# Patient Record
Sex: Male | Born: 1980 | Race: White | Hispanic: No | Marital: Single | Smoking: Never smoker
Health system: Southern US, Community
[De-identification: ages and names within clinical notes are randomized; demographics above are authoritative.]

## PROBLEM LIST (undated history)

## (undated) HISTORY — PX: FRACTURE SURGERY: SHX138

## (undated) HISTORY — PX: EYE SURGERY: SHX253

---

## 1999-05-04 ENCOUNTER — Ambulatory Visit (HOSPITAL_COMMUNITY): Admission: AD | Admit: 1999-05-04 | Discharge: 1999-05-04 | Payer: Self-pay | Admitting: Ophthalmology

## 1999-05-04 ENCOUNTER — Encounter (INDEPENDENT_AMBULATORY_CARE_PROVIDER_SITE_OTHER): Payer: Self-pay | Admitting: *Deleted

## 1999-05-04 ENCOUNTER — Encounter: Payer: Self-pay | Admitting: Ophthalmology

## 1999-12-28 ENCOUNTER — Encounter: Payer: Self-pay | Admitting: Surgery

## 1999-12-28 ENCOUNTER — Inpatient Hospital Stay (HOSPITAL_COMMUNITY): Admission: EM | Admit: 1999-12-28 | Discharge: 1999-12-30 | Payer: Self-pay | Admitting: Surgery

## 2000-02-03 ENCOUNTER — Encounter: Admission: RE | Admit: 2000-02-03 | Discharge: 2000-02-03 | Payer: Self-pay | Admitting: Neurosurgery

## 2000-04-04 ENCOUNTER — Encounter: Admission: RE | Admit: 2000-04-04 | Discharge: 2000-04-04 | Payer: Self-pay | Admitting: Neurosurgery

## 2004-11-15 ENCOUNTER — Emergency Department (HOSPITAL_COMMUNITY): Admission: EM | Admit: 2004-11-15 | Discharge: 2004-11-16 | Payer: Self-pay | Admitting: Emergency Medicine

## 2004-11-18 ENCOUNTER — Ambulatory Visit (HOSPITAL_COMMUNITY): Admission: RE | Admit: 2004-11-18 | Discharge: 2004-11-18 | Payer: Self-pay | Admitting: Orthopedic Surgery

## 2005-02-24 ENCOUNTER — Ambulatory Visit (HOSPITAL_COMMUNITY): Admission: RE | Admit: 2005-02-24 | Discharge: 2005-02-24 | Payer: Self-pay | Admitting: Orthopedic Surgery

## 2006-07-22 IMAGING — CR DG ANKLE COMPLETE 3+V*L*
3 series · 3 of 3 positions shown · non-contrast
Comparison: none

CLINICAL DATA: 23-year-old, injured ankle playing soccer.
 LEFT ANKLE ? 3 VIEW:
 There is a displaced transverse fracture through the distal aspect of the fibular shaft well above the ankle mortis.  There is also a displaced fracture involving the medial malleolus which is right at the level of the ankle mortis.  The tibia is shifted medially with some ankle mortis widening.  The posterior malleolus is intact.  The talus and subtalar joints appear normal.

[view not recorded (1 of 3)]
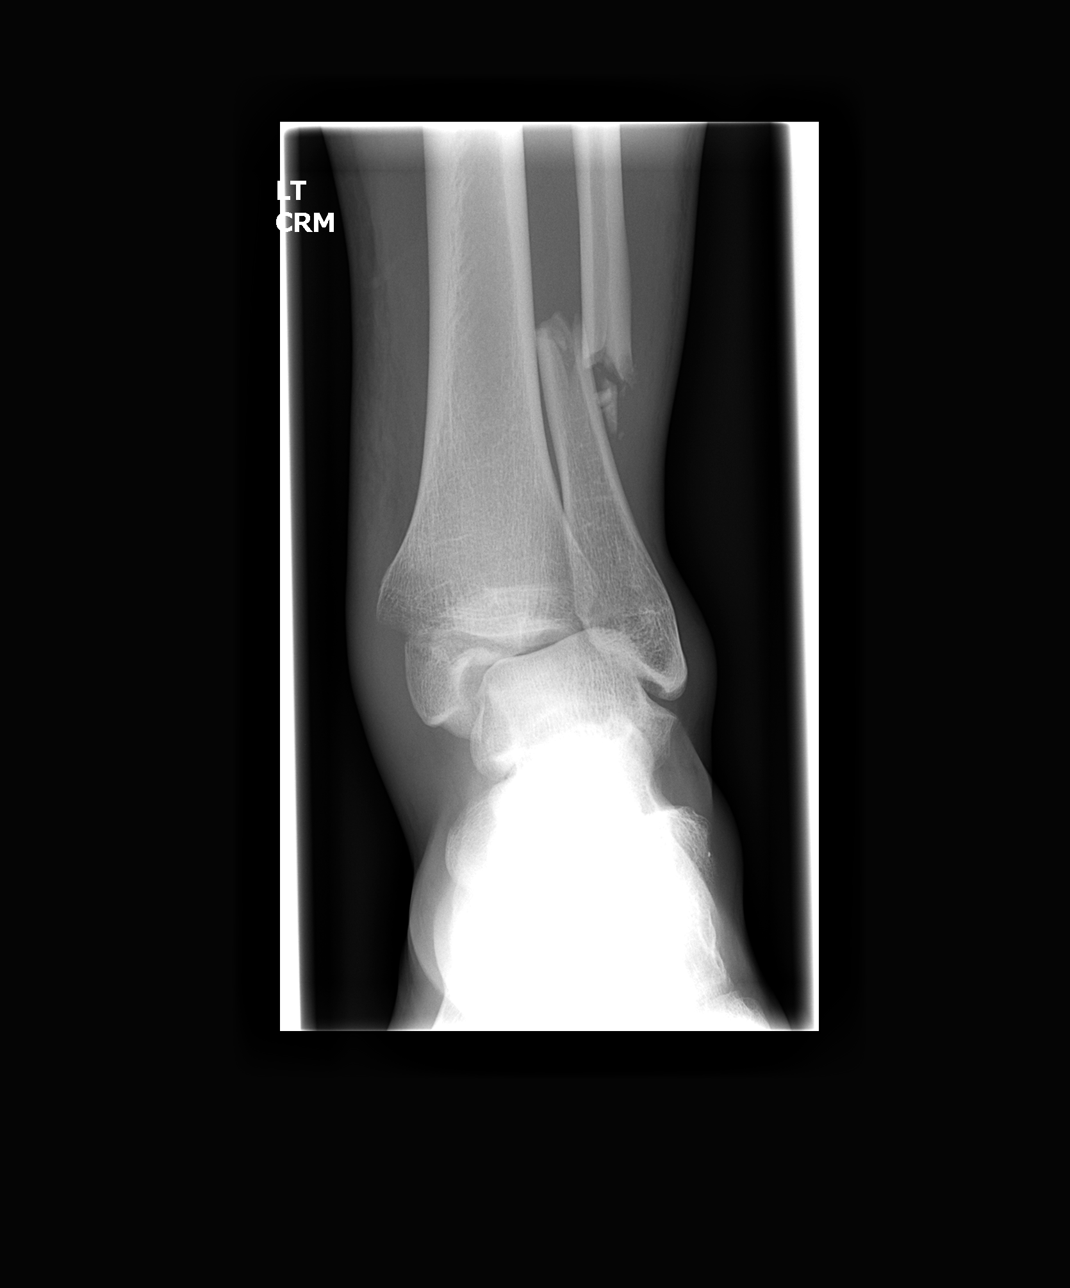

[view not recorded (2 of 3)]
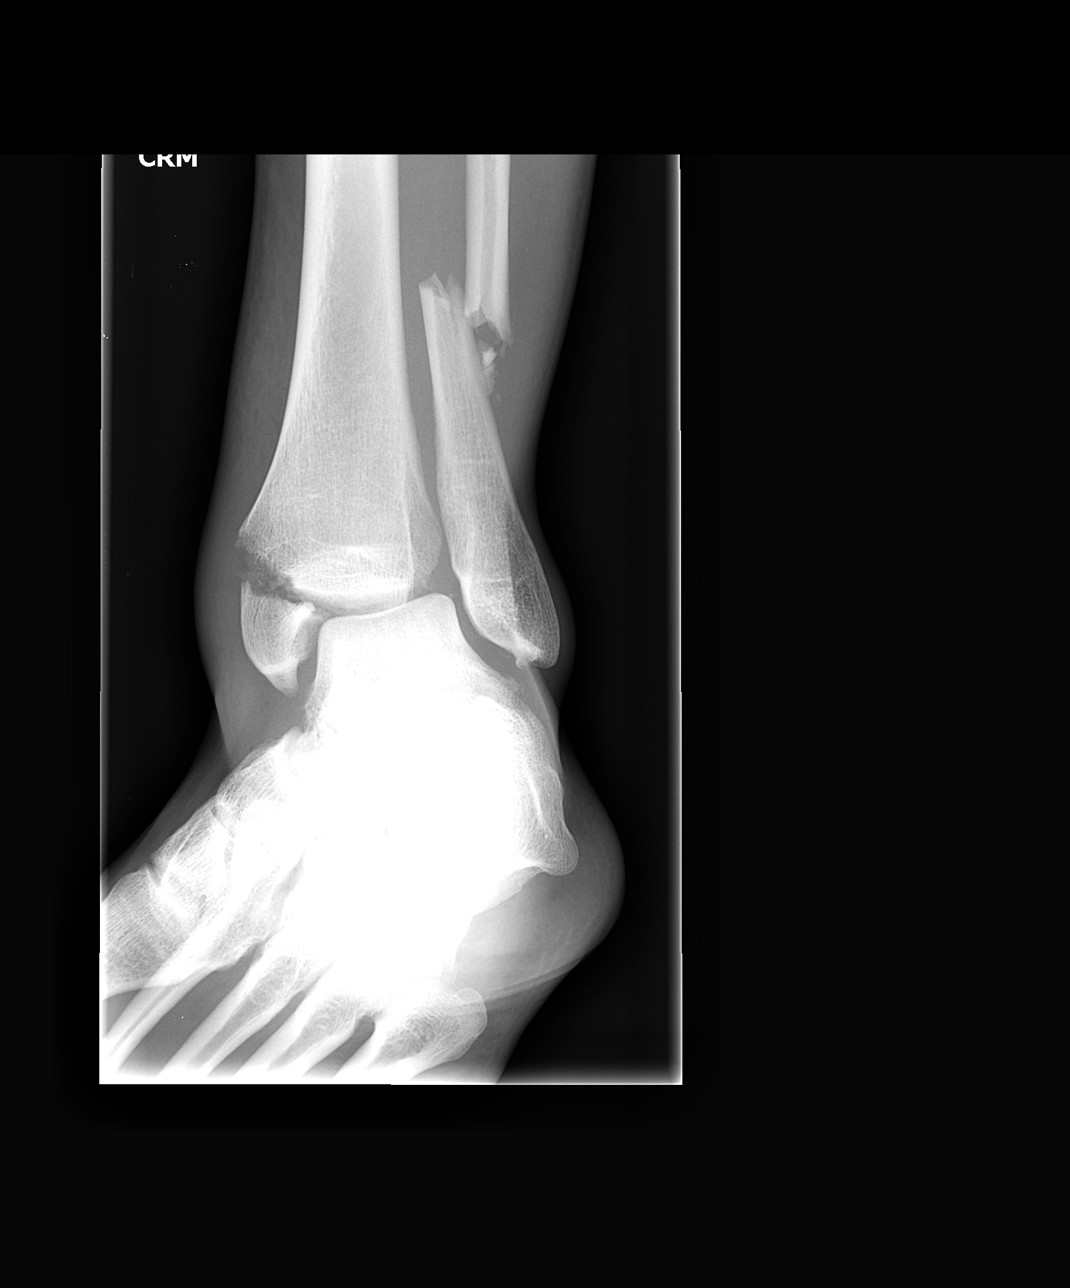

[view not recorded (3 of 3)]
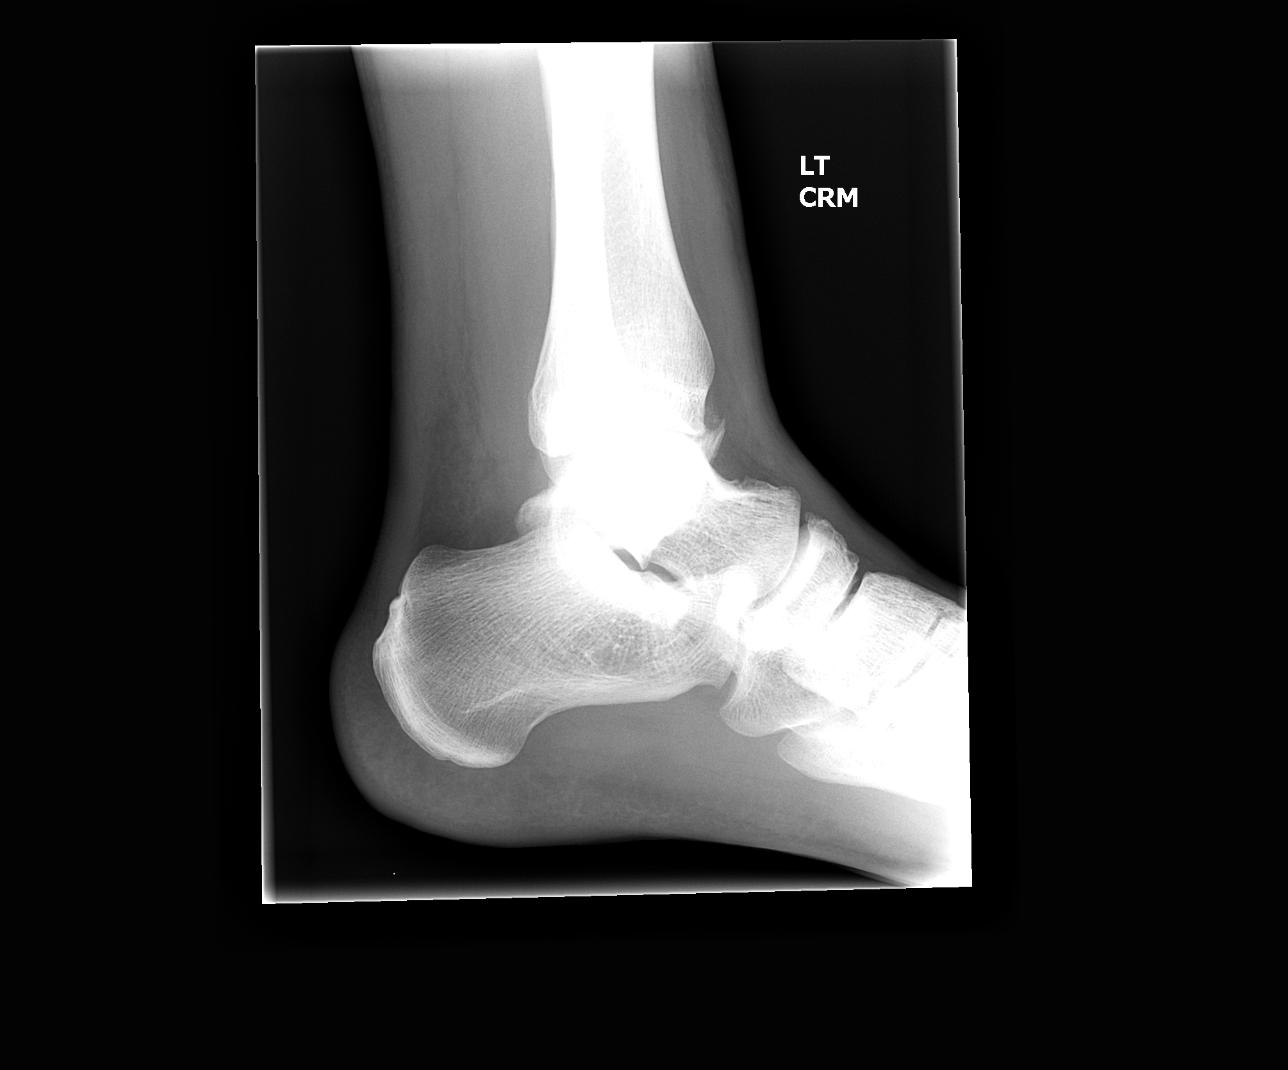

[3 of 3 positions shown; findings below may reference images not displayed]

IMPRESSION: Distal fibular fracture and medial malleolar fracture with lateral ankle mortis widening.

## 2006-07-25 IMAGING — RF DG ANKLE COMPLETE 3+V*L*
1 series · 6 of 6 positions shown · non-contrast
Comparison: 11/15/04.

CLINICAL DATA: ORIF.
LEFT ANKLE - 3 VIEWS:
Intraoperative fluoroscopic views during open reduction internal fixation.

[Series 1: run · 6 of 6 slices shown]
[im 1/6]
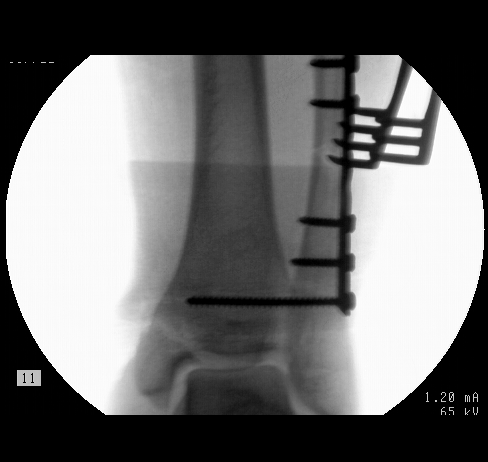
[im 2/6]
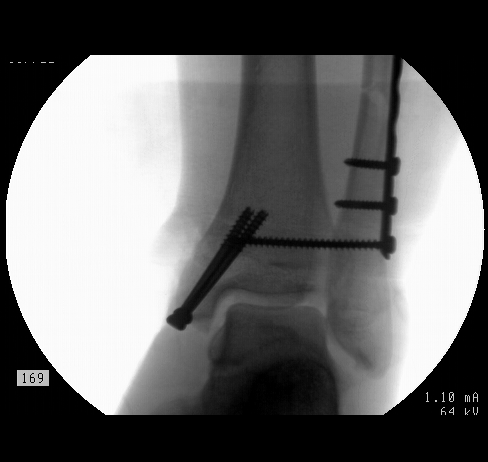
[im 3/6]
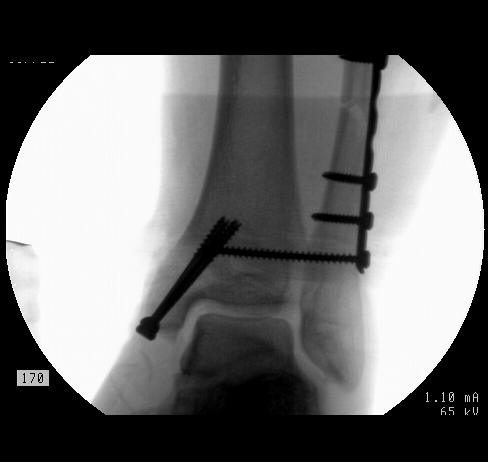
[im 4/6]
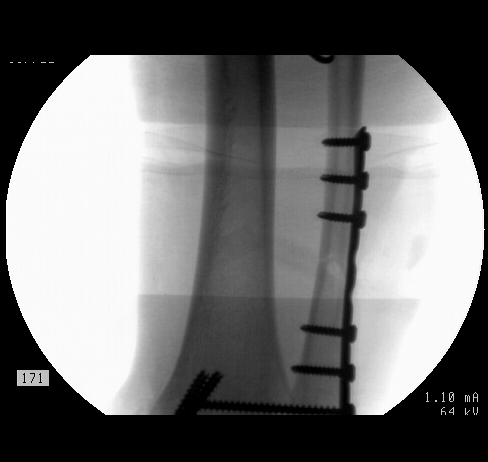
[im 5/6]
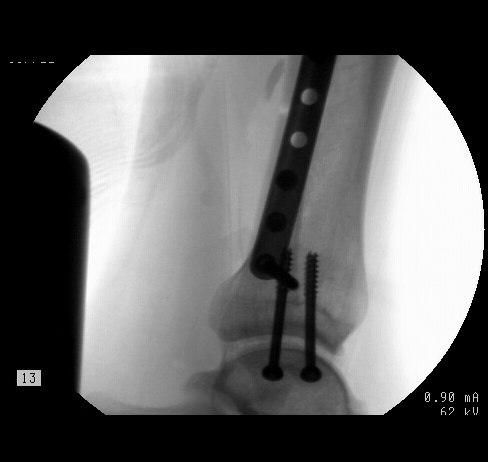
[im 6/6]
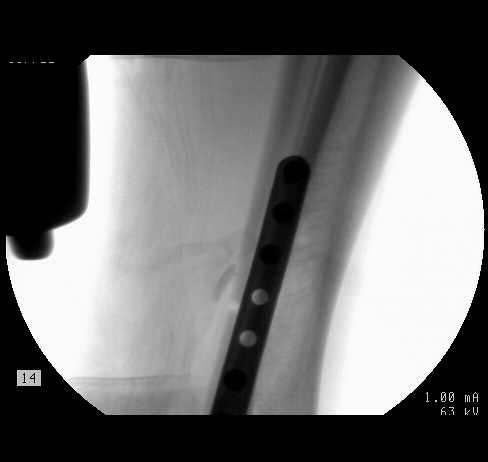

[6 of 6 positions shown; findings below may reference images not displayed]

Lateral plate and screw fixation has been performed of the distal left fibular shaft fracture.  There is also a single screw across the distal fibula and tibia.  Two screws have been inserted to reduce the medial malleolar fracture.  Following reduction, alignment is anatomic.
IMPRESSION: Status post open reduction internal fixation of the left distal fibular shaft fracture and medial malleolar fracture with anatomic alignment.

## 2010-01-20 ENCOUNTER — Emergency Department (HOSPITAL_COMMUNITY): Admission: EM | Admit: 2010-01-20 | Discharge: 2010-01-20 | Payer: Self-pay | Admitting: Emergency Medicine

## 2010-10-22 NOTE — Op Note (Signed)
Darius Henry, Darius Henry              ACCOUNT NO.:  [id number]   MEDICAL RECORD NO.:  [id number]          PATIENT TYPE:  AMB   LOCATION:  DAY                          FACILITY:  [facility]   PHYSICIAN:  Vania Rea. Supple, M.D.  DATE OF BIRTH:  May 07, 1981   DATE OF PROCEDURE:  11/18/2004  DATE OF DISCHARGE:                                 OPERATIVE REPORT   PREOPERATIVE DIAGNOSIS:  Displaced bimalleolar left ankle  fracture/dislocation.   POSTOPERATIVE DIAGNOSIS:  Displaced bimalleolar left ankle  fracture/dislocation.   PROCEDURE:  Open reduction/internal fixation of the left bimalleolar ankle  fracture/dislocation, including replacement of a syndesmotic screw.   SURGEON:  Vania Rea. Supple, M.D.   Threasa HeadsFrench Ana A. Shuford, P.A.-C.   ANESTHESIA:  General endotracheal.   TOURNIQUET TIME:  Approximately one hour and 15 minutes.   ESTIMATED BLOOD LOSS:  Minimal.   DRAINS:  None.   HISTORY:  Darius Henry is a 30 year old male who injured his left ankle playing  soccer this past Monday with immediate complaints of pain, deformity, and  inability to bear weight.  He was seen at a local emergency room, where the  left ankle was splinted and returned to the office this past Wednesday for  evaluation.  At that time, he was noted to have intact skin about the left  ankle but obvious swelling and deformity.  X-rays confirmed a bimalleolar  fracture/dislocation with a relatively high fibular fracture, consistent  with a Weber type C fracture/dislocation.  He is subsequently brought to the  operating room at this time for planned ORIF of his left ankle fracture.   I preoperatively discussed with Molli Hazard treatment options as well as risks  versus benefits thereof.  Possible complications were bleeding, infection,  neurovascular injury, DVT, PE, union, nonunion, post-traumatic arthritis,  the possibility for additional surgery, including hardware removal, were  reviewed.  He understands, accepts,  and agrees with the planned procedure.   PROCEDURE IN DETAIL:  After undergoing routine preoperative evaluation, the  patient received prophylactic antibiotics.  Transferred to the operating  room table in a supine position, where he underwent smooth induction of  general endotracheal anesthesia.  The tourniquet was applied to the left  thigh, and the left leg was then sterilely prepped and draped in a standard  fashion.  The leg was exsanguinated with a tourniquet inflated to 350 mmHg.  We started with a longitudinal incision over the distal fibula approximately  8 cm in length.  This was centered over the fracture site, which was  palpated.  Sharp dissection was used to divide the skin and subcutaneous  tissues, and the peroneal musculature and tendons were then reflected  posteriorly with subperiosteal elevation used to expose the fibular  fracture.  We then performed an anterior hockey stick medially about the  medial malleolus centered over the fracture site and elevated the skin flap  to sharp dissection and protected the underlying neurovascular structures  and then exposed the medial malleolar fracture site.  Interposed soft tissue  was then removed.  The ankle joint was then directly visualized and  copiously irrigated, and  interposed soft tissue as well as bone fragments  were then irrigated free from the ankle joint and the fracture site.  The  attention was then returned laterally, when under direct visualization, a  reduction of the fracture was performed and held in place with a bone-  holding clamp.  An eight-hole one-third fibular plate was then placed over  the lateral cortex of the fibula center at the fracture site, and no  significant contouring of the plate was required.  The plate was then  fastened, utilizing standard AO technique with 3.5 cortical screws  proximally and distally.  In addition, after direct reduction of the  syndesmosis and utilizing fluoroscopic  visualization, we placed a 50 mm 4.0  cancellous screw across the distal tibiofibular syndesmosis through the most  distal hole of the plate.  Our attention was then redirected medially, where  the fracture site was again exposed, irrigated, inspected, and cleaned, and  then under direct visualization, reduction was achieved and held in place  with a bone clamp while two threaded tip guidewires from the 4.5 cannulated  screw set were passed across the tip of the medial malleolus and up into the  distal tibia.  Fluoroscopic imaging was used to confirm proper positioning  and guide pins and were over-drilled, and 4.5 44 mm screws were placed x2,  obtaining excellent bony purchase.  Guidewires were removed.  At this point,  final fluoroscopic images were used to confirm good alignment over the  fracture sites, both medially and laterally as well as proper positioning of  the hardware.  In addition, symmetric alignment of the talus within the  tibial plafond.  There is no evidence for widening of the tibiofibular  syndesmosis.  The wounds were then finally irrigated.  Hemostasis was  obtained.  Closed in layers with 2-0 Vicryl for the deep fascia laterally  and 2-0 for the subcu and intracuticular 3-0 Monocryl for the skin  laterally, and then medially 2-0 Vicryl for the subcu, followed by Steri-  Strips over the skin.  Marcaine 0.5% with epi was set along the skin edges  at the incision.  A bulky dry dressing was then applied, followed by a well-  padded, short leg U-splint with the ankle in neutral position.  The  tourniquet was then let down, and the patient was extubated and taken to the  recovery room in stable condition.       KMS/MEDQ  D:  11/18/2004  T:  11/18/2004  Job:  [id number]
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
## 2010-10-22 NOTE — Op Note (Signed)
NAMEALFIE, RIDEAUX              ACCOUNT NO.:  [id number]   MEDICAL RECORD NO.:  [id number]          PATIENT TYPE:  AMB   LOCATION:  DAY                          FACILITY:  [facility]   PHYSICIAN:  Vania Rea. Supple, M.D.  DATE OF BIRTH:  1980-07-25   DATE OF PROCEDURE:  02/24/2005  DATE OF DISCHARGE:                                 OPERATIVE REPORT   PREOPERATIVE DIAGNOSIS:  Retained syndesmotic screw, left ankle.   POSTOPERATIVE DIAGNOSIS:  Retained syndesmotic screw, left ankle.   PROCEDURE:  Removal of syndesmotic screw, left ankle.   SURGEON:  Vania Rea. Supple, M.D.   ANESTHESIA:  LMA general.   ESTIMATED BLOOD LOSS:  Minimal.   TOURNIQUET TIME:  None was used.   HISTORY:  Darius Henry is a 30 year old male who is status post ORIF of a  displaced left ankle fracture, including the use of a syndesmotic screw, and  has now gone on to clinical and radiographic healing of the fracture but has  a retained syndesmotic screw, which has been planned for removal at  approximately three months post surgery to hopefully prevent hardware  failure.  Preoperatively, he has been counseled on treatment options as well  as risks versus benefits thereof, including possible complications of  bleeding, infection, neurovascular injury as well as persistent __________.  He understands and accepts.  Agrees to the planned procedure.   PROCEDURE IN DETAIL:  After undergoing routine preoperative evaluation, the  patient received prophylactic antibiotics.  Placed supine on the operating  room table.  He __________LMA general anesthesia.  Tourniquet applied to the  left thigh but was not inflated.  The left lower extremity was sterilely  prepped and draped in a standard fashion.  Approximately 8 cc of 0.50%  Marcaine was instilled about the distal aspect of the lateral incision.  Through the distal aspect of the previous incision, a knife was used to  divide the skin approximately 2 cm in length with sharp  dissection carried  deeply down to the hardware.  The distal aspect of the plate was identified,  and the screw head was exposed.  The screw was removed.  As it turns out,  the screw had already broken, so the portion through the fibula and up to  the tibia came out, and the portion within the tibia itself was left behind  due to the breakage.  It was not felt there would be any benefit gained by  trying to remove all of the additional hardware or removing the portion of  the broken screw within the distal tibia.  The portion of the broken screw  which potentially could have migrated out was removed in its entirety  without difficulty.  At this point, the wound was irrigated.  It was closed  with a 2-0 Vicryl to the deep layer and a running intracuticular 3-0  Monocryl to the skin followed by  Steri-Strips.  A dry dressing was then wrapped about the left ankle after  additional Marcaine with epi had been instilled into the peri-incisional  soft tissues.  A dry dressing wrapped about the ankle.  The  patient was then  extubated and taken to the recovery room in stable condition.      Vania Rea. Supple, M.D.  Electronically Signed     KMS/MEDQ  D:  02/24/2005  T:  02/24/2005  Job:  [id number]

## 2012-02-21 ENCOUNTER — Ambulatory Visit (INDEPENDENT_AMBULATORY_CARE_PROVIDER_SITE_OTHER): Payer: BC Managed Care – PPO | Admitting: Family Medicine

## 2012-02-21 VITALS — BP 120/80 | HR 69 | Temp 98.7°F | Resp 18 | Ht 70.0 in | Wt 177.0 lb

## 2012-02-21 DIAGNOSIS — H6090 Unspecified otitis externa, unspecified ear: Secondary | ICD-10-CM

## 2012-02-21 DIAGNOSIS — H7290 Unspecified perforation of tympanic membrane, unspecified ear: Secondary | ICD-10-CM | POA: Insufficient documentation

## 2012-02-21 DIAGNOSIS — H60399 Other infective otitis externa, unspecified ear: Secondary | ICD-10-CM

## 2012-02-21 DIAGNOSIS — H6640 Suppurative otitis media, unspecified, unspecified ear: Secondary | ICD-10-CM

## 2012-02-21 MED ORDER — CEFDINIR 300 MG PO CAPS: 300.0000 mg | ORAL_CAPSULE | Freq: Two times a day (BID) | ORAL | Status: AC

## 2012-02-21 MED ORDER — OFLOXACIN 0.3 % OT SOLN: 5.0000 [drp] | Freq: Every day | OTIC | Status: AC

## 2012-02-21 NOTE — Patient Instructions (Signed)
Eardrum Perforation    A ruptured eardrum can be caused by infection or injury. Injury may result from a loud noise, foreign objects in the ears, inserting cotton-tipped swabs, trauma such as a hard slap, or changes in pressure such as scuba diving, flying, or driving in the mountains. Symptoms can include hearing loss, pain, ringing in the ears and discharge or bleeding from the ear. More serious eardrum injuries may cause dizziness, vomiting, or even facial paralysis. You should see your caregiver right away if you have these more serious symptoms.  Ruptured eardrums usually heal on their own if the ear is kept dry. Do not go swimming or get wet. Place a cotton ball covered with petroleum jelly in the ear when showering or washing your hair. Antibiotics (oral or ear drops) may be needed to treat or prevent infection. While surgery is rarely required, placement of a small paper patch over the perforation improves the chance that healing will occur if it does not heal on its own. See your caregiver for follow-up as recommended to be sure the eardrum is healing properly.   SEEK IMMEDIATE MEDICAL CARE IF:   · You have bleeding or pus-like (purulent) material coming from your ear.   · You have problems with balance, feel dizzy, or feel sick to your stomach (nausea) and vomit.   · You develop increased pain.   · You have a fever.   Document Released: 06/30/2004 Document Revised: 02/02/2011 Document Reviewed: 03/27/2009  [employer]® Patient Information ©2012 [employer].

## 2012-02-21 NOTE — Progress Notes (Signed)
Subjective: Patient has been having pain in his left ear for about a month. He had gone swimming and had some symptoms. It's size after a fall. He had gone into a manic clinic where he was treated with amoxicillin. The problems have persisted with off-and-on drainage ever since then. Not as much pain he does have a history of ear problems when he child, and has had tubes in the past. It is been years ago and in [place] since he last saw an ENT doctor.  Objective: Right TM is normal. He has some wax in the canal. Left TM has a 2 or 3 mm part perforation in the posterior inferior portion of the drum. There is inflammation of the drum and of the canal. Neck supple without significant nodes. Throat clear. Chest clear. Heart regular.  Assessment: Subjective otitis media and otitis externa secondary to TM perforation  Plan: Refer to ENT Omnicef Floxin otic  Return if problems

## 2014-02-11 ENCOUNTER — Ambulatory Visit (INDEPENDENT_AMBULATORY_CARE_PROVIDER_SITE_OTHER): Payer: BC Managed Care – PPO | Admitting: Internal Medicine

## 2014-02-11 VITALS — BP 128/82 | HR 66 | Temp 97.7°F | Resp 16 | Ht 69.5 in | Wt 177.8 lb

## 2014-02-11 DIAGNOSIS — H6121 Impacted cerumen, right ear: Secondary | ICD-10-CM

## 2014-02-11 DIAGNOSIS — H6120 Impacted cerumen, unspecified ear: Secondary | ICD-10-CM

## 2014-02-11 NOTE — Progress Notes (Signed)
   Subjective:   Patient ID: Darius Henry, male    DOB: 11-Sep-1980, 33 y.o.   MRN: [id number]  This chart was scribed for Ellamae Sia, MD by Milly Jakob, ED Scribe. The patient was seen in room 14. Patient's care was started at 4:25 PM.  HPI HPI Comments: Darius Henry is a 33 y.o. male who presents to [facility] complaining of a "distant" discomfort in his right ear. He reports that he feels this about once per year, and in the past it has been caused by a buildup of wax.  He reports that he has been diagnosed with a perforation of his left TM in the past.   Review of Systems  Non-contributory  Objective:  Physical Exam  Nursing note and vitals reviewed. Constitutional: He is oriented to person, place, and time. He appears well-developed.  HENT:  Right auditory canal is completely occluded by wax. The left auditory canal is partially occluded by wax so that only the top part of the TM is visible, and the perforation that is reported is not seen.  Neurological: He is alert and oriented to person, place, and time.    Assessment & Plan:   I have completed the patient encounter in its entirety as documented by the scribe, with editing by me where necessary. Alphonsine Minium P. Merla Riches, M.D.  Cerumen impaction, right  Irrigation was successful the right ear the canal is clear without underlying problems I elected not to irrigate the partial obstruction of the left ear /advised no drops or irrigation in [place]
